# Patient Record
Sex: Male | Born: 1980 | Race: Asian | Hispanic: No | Marital: Single
Health system: Southern US, Community
[De-identification: ages and names within clinical notes are randomized; demographics above are authoritative.]

---

## 2005-12-06 ENCOUNTER — Emergency Department (HOSPITAL_COMMUNITY): Admission: EM | Admit: 2005-12-06 | Discharge: 2005-12-06 | Payer: Self-pay | Admitting: Family Medicine

## 2008-04-01 ENCOUNTER — Inpatient Hospital Stay (HOSPITAL_COMMUNITY): Admission: EM | Admit: 2008-04-01 | Discharge: 2008-04-03 | Payer: Self-pay | Admitting: Family Medicine

## 2008-11-02 ENCOUNTER — Emergency Department (HOSPITAL_COMMUNITY): Admission: EM | Admit: 2008-11-02 | Discharge: 2008-11-02 | Payer: Self-pay | Admitting: Emergency Medicine

## 2008-11-15 ENCOUNTER — Emergency Department (HOSPITAL_COMMUNITY): Admission: EM | Admit: 2008-11-15 | Discharge: 2008-11-16 | Payer: Self-pay | Admitting: Emergency Medicine

## 2010-05-04 LAB — BASIC METABOLIC PANEL
CO2: 29 mEq/L (ref 19–32)
Calcium: 9.1 mg/dL (ref 8.4–10.5)
GFR calc Af Amer: >60 mL/min (ref >60)
GFR calc non Af Amer: >60 mL/min (ref >60)
Glucose, Bld: 96 mg/dL (ref 70–99)
Potassium: 3.9 mEq/L (ref 3.5–5.1)
Sodium: 138 mEq/L (ref 135–145)

## 2010-05-04 LAB — CULTURE, BLOOD (ROUTINE X 2)
Culture: NO GROWTH
Culture: NO GROWTH

## 2010-05-04 LAB — CBC
HCT: 39.8 % (ref 39.0–52.0)
Hemoglobin: 12.6 g/dL — ABNORMAL LOW (ref 13.0–17.0)
RBC: 5.72 MIL/uL (ref 4.22–5.81)
RDW: 15.1 % (ref 11.5–15.5)

## 2010-05-04 LAB — DIFFERENTIAL
Basophils Absolute: 0.1 10*3/uL (ref 0.0–0.1)
Basophils Relative: 1 % (ref 0–1)
Lymphocytes Relative: 18 % (ref 12–46)
Lymphs Abs: 1.6 10*3/uL (ref 0.7–4.0)
Monocytes Relative: 11 % (ref 3–12)
Neutro Abs: 6.1 10*3/uL (ref 1.7–7.7)

## 2010-06-06 NOTE — Consult Note (Signed)
NAMEMARSHA, HILLMAN NO.:  [id number]   MEDICAL RECORD NO.:  [id number]          PATIENT TYPE:  INP   LOCATION:  3012                         FACILITY:  [facility]   PHYSICIAN:  Nadara Mustard, MD     DATE OF BIRTH:  03-18-1980   DATE OF CONSULTATION:  04/02/2008  DATE OF DISCHARGE:                                 CONSULTATION   HISTORY OF PRESENT ILLNESS:  The patient is a 30 year old gentleman with  no past medical problems, who by report cut his right leg on broken  glass about 1 week ago.  The patient presents for evaluation with  cellulitis to the right foot with lacerations to the right foot.  The  patient has been admitted and started on appropriate IV antibiotics.   PAST MEDICAL HISTORY:  No known significant past medical history.   SURGICAL HISTORY:  None.   ALLERGIES:  None.   REVIEW OF SYSTEMS:  None.   SOCIAL HISTORY:  The patient smokes.   PHYSICAL EXAMINATION:  VITAL SIGNS:  Stable.  GENERAL:  He is alert, oriented, afebrile.  He has no adenopathy.  EXTREMITIES:  There is no cellulitis ascending past the foot.  He has  multiple lacerations on the foot and ankle, which appear to be healing  well with good granulation tissue.  There is no abscess.  No purulent  drainage.  He has a good dorsalis pedis and posterior tibial pulse.  The  skin is wrinkling well with resolving cellulitis.   ASSESSMENT:  Lacerations, right foot with resolving cellulitis with his  foot neurovascularly intact.   PLAN:  Recommended dialysis, soap cleansing daily to the right foot  followed by Silvadene dressing changes to the wound.  He should wear a  postoperative shoe with the dressing and continue dressing changes until  the wound is completely healed.  I will follow up as needed.      Nadara Mustard, MD  Electronically Signed     MVD/MEDQ  D:  04/02/2008  T:  04/03/2008  Job:  [phone]

## 2010-06-06 NOTE — H&P (Signed)
Terry Baker, NEGRO NO.:  [id number]   MEDICAL RECORD NO.:  [id number]          PATIENT TYPE:  EMS   LOCATION:  MAJO                         FACILITY:  [facility]   PHYSICIAN:  Eduard Clos, MDDATE OF BIRTH:  12/15/1980   DATE OF ADMISSION:  04/01/2008  DATE OF DISCHARGE:                              HISTORY & PHYSICAL   CHIEF COMPLAINT:  Right lower extremity swelling.   HISTORY OF PRESENT ILLNESS:  A 30 year old male with no significant past  medical history presented to the ER because of increasing swelling and  erythema over the right foot over the last 3-4 days.  The patient states  that it began with a glass falling on his leg and eventually he started  to having lacerated wounds and it is swollen and erythematous.  He  denies any fevers or chills.  He has pain on standing and there is no  restriction of movement.  In the ER, the patient had an x-ray which  showed no evidence for a fracture and only showed soft tissue swelling.  The patient has been admitted for further management.  The patient  denies any chest pain or shortness of breath.  He denies any weakness of  limbs.  He denies any dizziness, loss of consciousness, abdominal pain,  nausea, vomiting, diarrhea, fever or chills.   PAST MEDICAL HISTORY:  Nothing significant.   PAST SURGICAL HISTORY:  None.   MEDICATIONS PRIOR TO ADMISSION:  None.   ALLERGIES:  NO KNOWN DRUG ALLERGIES.   SOCIAL HISTORY:  The patient smokes cigarettes, has been advised to quit  smoking.  Drinks alcohol occasionally.  He denies any drug abuse.   REVIEW OF SYSTEMS:  As per history of present illness, nothing else  significant.   PHYSICAL EXAMINATION:  GENERAL:  Patient examined at bedside, not in  acute distress.  VITAL SIGNS:  Blood pressure is 115/67, pulse 78 per minute, temperature  97.8, respirations 18 per minute.  O2 saturations 99%.  HEENT:  Anicteric, no pallor.  CHEST:  Bilateral air entry present.   No rhonchi, no crepitation.  HEART:  S1-S2 heard.  ABDOMEN:  Soft, nontender.  Bowel sounds heard.  CNS:   Alert, awake.  Oriented to time, place and person.  Moves upper  and lower extremities 5/5.  EXTREMITIES:  There is swelling extending from his toes to ankle on the  right foot with two lacerated wounds which are deep.  He has no active  discharge.  Both lacerations are on the dorsal side of the leg, one is  around 5-cm, the other one is 3-cm.  On palpation, there is no  tenderness.  There is no restriction of movement.  The rest of the  extremities are showing no edema.  Pulses felt.   LABORATORY DATA:  CBC and BMET have been ordered.  Blood cultures have  been ordered.  X-ray of the right foot showed no evidence for fracture,  soft tissue swelling is seen.   ASSESSMENT:  Right foot cellulitis, status post trauma.   PLAN:  We will  admit the patient to the medical floor, start on  vancomycin and Zosyn, and start the patient on also tetanus toxoid  injection, IV fluids.  Get an MRI of the right foot to rule out  osteomyelitis or abscess.  Further recommendations as condition evolves.      Eduard Clos, MD  Electronically Signed     ANK/MEDQ  D:  04/01/2008  T:  04/01/2008  Job:  [phone]

## 2010-06-06 NOTE — Discharge Summary (Signed)
NAMELITTLETON, HAUB NO.:  [id number]   MEDICAL RECORD NO.:  [id number]          PATIENT TYPE:  INP   LOCATION:  3012                         FACILITY:  [facility]   PHYSICIAN:  Charlestine Massed, MDDATE OF BIRTH:  06/21/1980   DATE OF ADMISSION:  04/01/2008  DATE OF DISCHARGE:                               DISCHARGE SUMMARY   PRIMARY CARE PHYSICIAN:  Unassigned.  The patient is advised to set up  primary care physician after he is discharged.   REASON FOR ADMISSION:  Right lower extremity swelling.   DISCHARGE DIAGNOSIS:  1. Right foot cellulitis secondary to trauma.  2. Traumatic right foot wound.  3. Tendon of the fourth digit on the right foot laceration.   DISCHARGE MEDICATIONS:  1. Doxycycline 100 mg p.o. b.i.d. for 12 more days.  2. Daily dressing for the right foot with Silvadene until it is      healed.   HOSPITAL COURSE:  [name] is a 30 year old gentleman who is from  [place] and speaks very little English who came to the emergency room  because he developed increased swelling of the right foot over the past  3 to 4 days when he presented.  The patient had a trauma last Saturday  when a glass piece fell on his foot and he had two lacerations on the  dorsum of his right foot.  He just cleaned it with soap and water and  there did put on any ointments and did not take care of the wound well  so when he presented he was having swelling which has been developing  over 3 days and with pain.  The patient was found to have cellulitis of  the right foot and it was red and erythematous.  An MRA was done by the  admitting physician which revealed no evidence of abscess or fasciitis.  There was a mild laceration to the fourth extensor tendon of the right  foot seen.  Orthopedic consult was called in view of that finding.  Orthopedics suggested no further management needed just to clean the  wound with soap daily and apply Silvadene dressing every day  until the  wound is healed.  The patient will be continued on doxycycline orally.   During when he was admitted, the patient was initially given Zosyn and  vancomycin together.  At that time he developed hives.  The patient is  being currently labeled as allergic to vancomycin and Zosyn and we are  not clear which one caused the allergy at this time.   DISCHARGE INSTRUCTIONS:  1. The patient has been advised to set up a  primary care doctor once      he is discharged and to see for follow-up and the patient needs to      follow up with the primary care doctor to check on his wound in 1      week.  If he cannot set up by a doctor, the patient has been given      instructions to either go to [facility] or to come to      the [facility] surgical clinic for further follow-up of      his wound.  2. Daily dressing to the right foot wound, clean with soap and saline      and dried and then apply Silvadene dressing daily until the wound      is completely healed.  Home care set up has been arranged by case      manager for dressing on the wound.  3. The patient has been advised to use postop boots as recommended by      orthopedics.  Boot has been supplied to him already in the      hospital.  The patient has been advised to use it on the right foot      when walking.  4. Keep the right foot dry, avoid soaking.   FOLLOW UP:  The patient has again been given instructions as mentioned  above to follow up with a  primary doctor or at any urgent care center  for  wound follow-up.      Charlestine Massed, MD  Electronically Signed     UT/MEDQ  D:  04/03/2008  T:  04/03/2008  Job:  [id number]

## 2019-06-03 ENCOUNTER — Encounter (HOSPITAL_COMMUNITY): Payer: Self-pay

## 2019-06-03 ENCOUNTER — Emergency Department (HOSPITAL_COMMUNITY): Payer: BC Managed Care – PPO

## 2019-06-03 ENCOUNTER — Emergency Department (HOSPITAL_COMMUNITY)
Admission: EM | Admit: 2019-06-03 | Discharge: 2019-06-03 | Disposition: A | Discharge status: Home / Self Care | Payer: BC Managed Care – PPO | Attending: Emergency Medicine | Admitting: Emergency Medicine

## 2019-06-03 DIAGNOSIS — Z20822 Contact with and (suspected) exposure to covid-19: Secondary | ICD-10-CM | POA: Diagnosis not present

## 2019-06-03 DIAGNOSIS — Z87891 Personal history of nicotine dependence: Secondary | ICD-10-CM | POA: Diagnosis not present

## 2019-06-03 DIAGNOSIS — R059 Cough, unspecified: Secondary | ICD-10-CM

## 2019-06-03 DIAGNOSIS — R05 Cough: Secondary | ICD-10-CM | POA: Diagnosis not present

## 2019-06-03 LAB — POC SARS CORONAVIRUS 2 AG -  ED: SARS Coronavirus 2 Ag: NEGATIVE

## 2019-06-03 MED ORDER — AZITHROMYCIN 250 MG PO TABS: ORAL_TABLET | ORAL | 0 refills | Status: AC

## 2019-06-03 MED ORDER — BENZONATATE 100 MG PO CAPS: 100.0000 mg | ORAL_CAPSULE | Freq: Three times a day (TID) | ORAL | 0 refills | Status: DC

## 2019-06-03 MED ORDER — CEFTRIAXONE SODIUM 1 G IJ SOLR
1.0000 g | Freq: Once | INTRAMUSCULAR | Status: AC
Start: 2019-06-03 — End: 2019-06-03
  Administered 2019-06-03: 1 g via INTRAMUSCULAR
  Filled 2019-06-03: qty 10

## 2019-06-03 MED ORDER — LIDOCAINE HCL (PF) 1 % IJ SOLN
INTRAMUSCULAR | Status: AC
  Administered 2019-06-03: 22:00:00 1 mL
  Filled 2019-06-03: qty 5

## 2019-06-03 NOTE — Discharge Instructions (Addendum)
You were seen today for cough. Your chest xray was clear and your covid test is pending.  Please make sure you wear a mask and isolate yourself from others until you know your test results.  We are going to treat you with antibiotics and refer you to a new primary medical doctor to follow-up with.  If you have any new or worsening symptoms then please return to the emergency department.

## 2019-06-03 NOTE — ED Provider Notes (Signed)
[place] EMERGENCY DEPARTMENT Provider Note   CSN: [id number] Arrival date & time: 06/03/19  1946     History Chief Complaint  Patient presents with  . Cough    Terry Baker is a 39 y.o. male.  Patient is a 39 year old male with past medical history of smoking presented to the emergency department for cough for the past 2 weeks.  Reports he has had a dry cough without any other symptoms.  Denies any shortness of breath, chest pain, nausea, vomiting, decreased appetite, weight loss.  Denies any sick contacts or recent travel.  Burmese interpreter was used during the entirety of this visit.        History reviewed. No pertinent past medical history.  There are no problems to display for this patient.   History reviewed. No pertinent surgical history.     No family history on file.  Social History   Tobacco Use  . Smoking status: Not on file  Substance Use Topics  . Alcohol use: Not on file  . Drug use: Not on file    Home Medications Prior to Admission medications   Medication Sig Start Date End Date Taking? Authorizing Provider  azithromycin (ZITHROMAX Z-PAK) 250 MG tablet Take 2 tablets (500 mg total) by mouth daily for 1 day, THEN 1 tablet (250 mg total) daily for 4 days. 06/03/19 06/08/19  Alveria Apley, PA-C  benzonatate (TESSALON) 100 MG capsule Take 1 capsule (100 mg total) by mouth every 8 (eight) hours. 06/03/19   Alveria Apley, PA-C    Allergies    Patient has no known allergies.  Review of Systems   Review of Systems  Constitutional: Negative.   HENT: Negative.   Eyes: Negative for visual disturbance.  Respiratory: Positive for cough. Negative for chest tightness, shortness of breath and wheezing.   Cardiovascular: Negative.   Gastrointestinal: Negative for abdominal pain, nausea and vomiting.  Genitourinary: Negative for dysuria.  Musculoskeletal: Negative for back pain.  Allergic/Immunologic: Negative for immunocompromised  state.  Neurological: Negative for dizziness and light-headedness.    Physical Exam Updated Vital Signs BP 113/67 (BP Location: Left Arm)   Pulse 92   Temp (!) 100.5 F (38.1 C) (Oral)   Resp 16   SpO2 97%   Physical Exam Vitals and nursing note reviewed.  Constitutional:      General: He is not in acute distress.    Appearance: Normal appearance. He is not ill-appearing, toxic-appearing or diaphoretic.  HENT:     Head: Normocephalic.     Nose: Nose normal.     Mouth/Throat:     Mouth: Mucous membranes are moist.  Eyes:     Conjunctiva/sclera: Conjunctivae normal.  Cardiovascular:     Rate and Rhythm: Normal rate.  Pulmonary:     Effort: Pulmonary effort is normal.  Abdominal:     General: Abdomen is flat.  Skin:    General: Skin is dry.  Neurological:     General: No focal deficit present.     Mental Status: He is alert.  Psychiatric:        Mood and Affect: Mood normal.     ED Results / Procedures / Treatments   Labs (all labs ordered are listed, but only abnormal results are displayed) Labs Reviewed  POC SARS CORONAVIRUS 2 AG -  ED    EKG None  Radiology DG Chest Portable 1 View  Result Date: 06/03/2019 CLINICAL DATA:  Cough EXAM: PORTABLE CHEST 1 VIEW COMPARISON:  None. FINDINGS: The heart size and mediastinal contours are within normal limits. Both lungs are clear. The visualized skeletal structures are unremarkable. IMPRESSION: No active disease. Electronically Signed   By: Jonna Clark M.D.   On: 06/03/2019 21:31    Procedures Procedures (including critical care time)  Medications Ordered in ED Medications  cefTRIAXone (ROCEPHIN) injection 1 g (has no administration in time range)    ED Course  I have reviewed the triage vital signs and the nursing notes.  Pertinent labs & imaging results that were available during my care of the patient were reviewed by me and considered in my medical decision making (see chart for details).  Clinical  Course as of Jun 02 2148  Wed Jun 03, 2019  2139 Well-appearing 39 year old presenting with cough for 2 weeks.  Low-grade fever of 100.5.  He is PERC negative and denies any other symptoms, recent travel.  His chest x-ray is completely clear and his lungs sound clear.  However, given the duration of 2 weeks and now fever I do have a concern for a community-acquired pneumonia.  Covid test is pending.  Will start patient on antibiotics and advised to self isolate and advised on return precautions.  He was referred to a new primary medical doctor.  He otherwise appears well and does not meet admission criteria.   [KM]    Clinical Course User Index [KM] Jeral Pinch   MDM Rules/Calculators/A&P                      Based on review of vitals, medical screening exam, lab work and/or imaging, there does not appear to be an acute, emergent etiology for the patient's symptoms. Counseled pt on good return precautions and encouraged both PCP and ED follow-up as needed.  Prior to discharge, I also discussed incidental imaging findings with patient in detail and advised appropriate, recommended follow-up in detail.  Clinical Impression: 1. Cough     Disposition: Discharge  Prior to providing a prescription for a controlled substance, I independently reviewed the patient's recent prescription history on the [place] Controlled Substance Reporting System. The patient had no recent or regular prescriptions and was deemed appropriate for a brief, less than 3 day prescription of narcotic for acute analgesia.  This note was prepared with assistance of Conservation officer, historic buildings. Occasional wrong-word or sound-a-like substitutions may have occurred due to the inherent limitations of voice recognition software.  Final Clinical Impression(s) / ED Diagnoses Final diagnoses:  Cough    Rx / DC Orders ED Discharge Orders         Ordered    benzonatate (TESSALON) 100 MG capsule  Every 8  hours     06/03/19 2150    azithromycin (ZITHROMAX Z-PAK) 250 MG tablet     06/03/19 2150           Jeral Pinch 06/03/19 2151    Arby Barrette, MD 06/04/19 [phone]

## 2019-06-03 NOTE — ED Triage Notes (Signed)
Pt reports that he has had a cough for the past six weeks. Fevers today.

## 2021-02-11 IMAGING — DX DG CHEST 1V PORT
1 series · 1 of 1 positions shown · non-contrast
Comparison: None.

CLINICAL DATA: Cough

EXAM:
PORTABLE CHEST 1 VIEW

[chest]
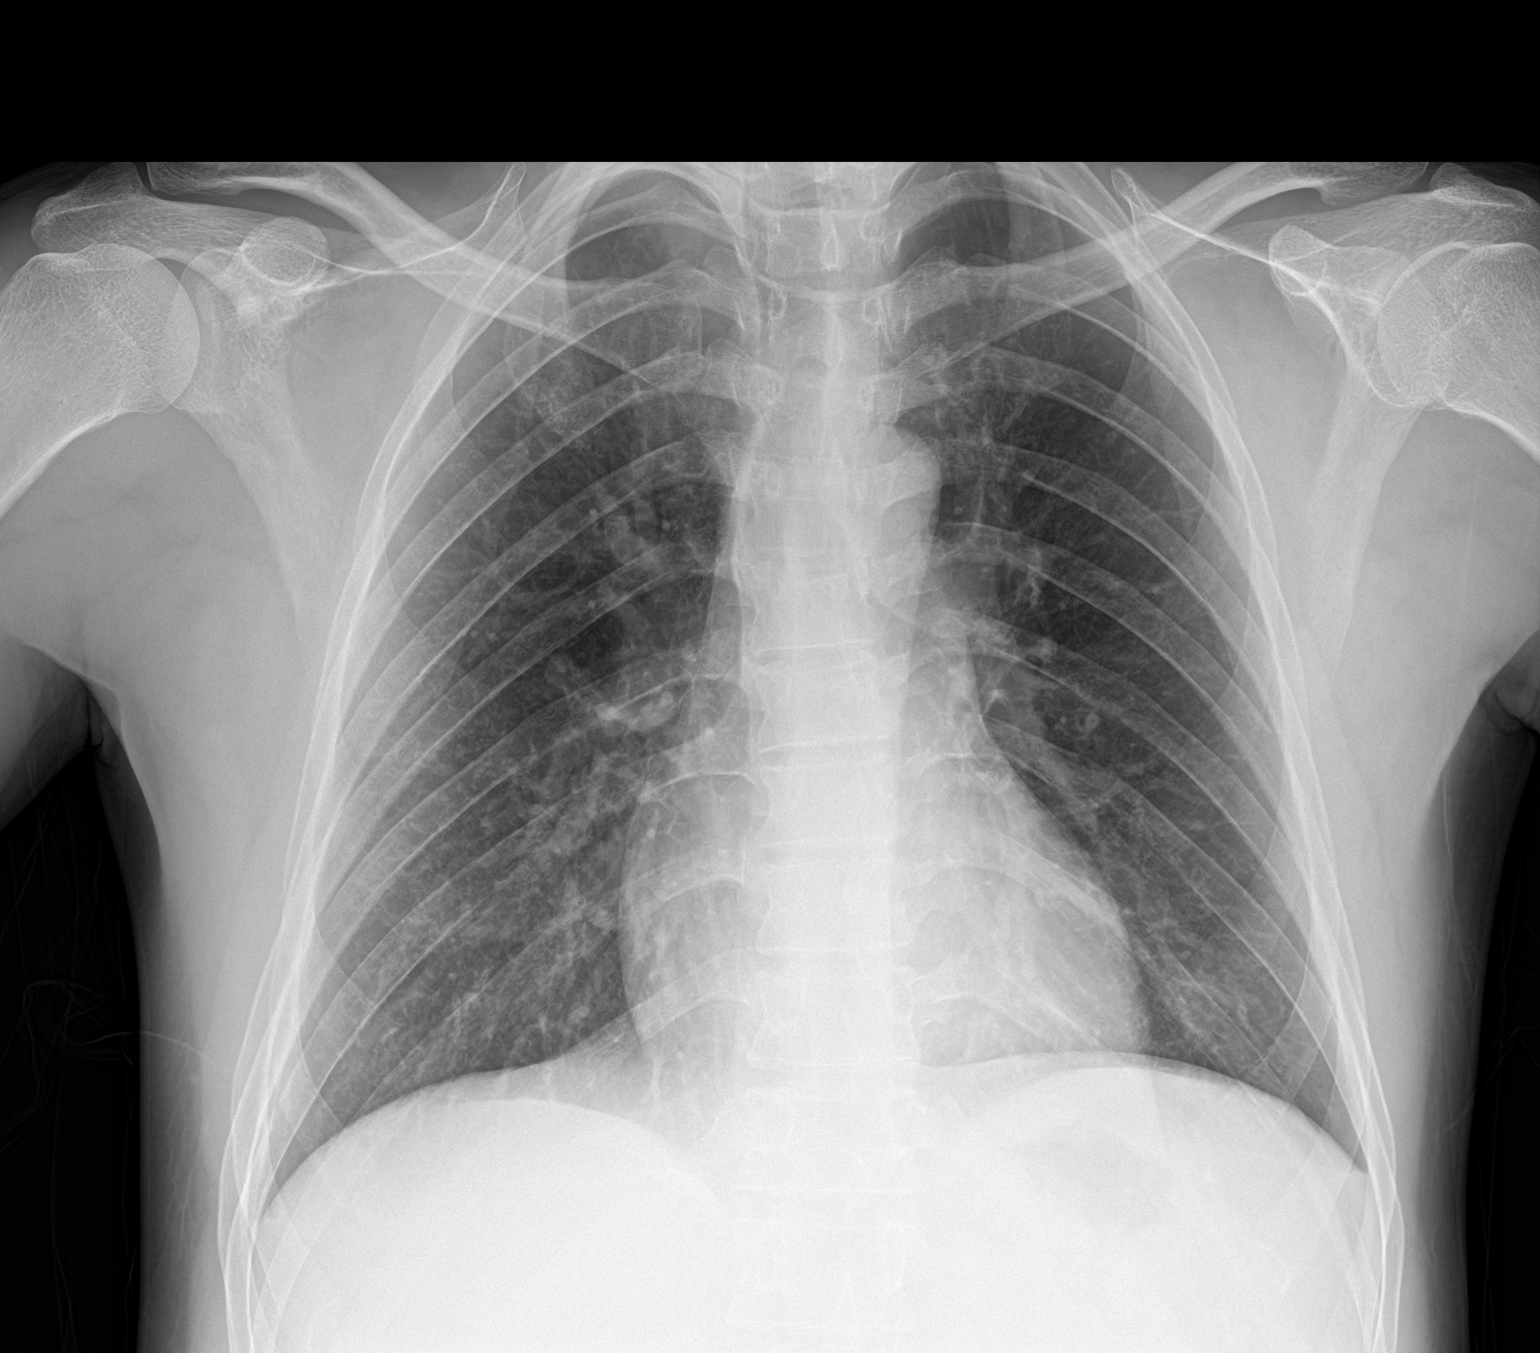

[1 of 1 positions shown; findings below may reference images not displayed]

FINDINGS: The heart size and mediastinal contours are within normal limits.
Both lungs are clear. The visualized skeletal structures are
unremarkable.
IMPRESSION: No active disease.

## 2023-02-08 ENCOUNTER — Emergency Department (HOSPITAL_COMMUNITY): Payer: Self-pay

## 2023-02-08 ENCOUNTER — Emergency Department (HOSPITAL_COMMUNITY)
Admission: EM | Admit: 2023-02-08 | Discharge: 2023-02-09 | Disposition: A | Discharge status: Home / Self Care | Payer: Self-pay | Attending: Emergency Medicine | Admitting: Emergency Medicine

## 2023-02-08 ENCOUNTER — Encounter (HOSPITAL_COMMUNITY): Payer: Self-pay

## 2023-02-08 DIAGNOSIS — J020 Streptococcal pharyngitis: Secondary | ICD-10-CM | POA: Insufficient documentation

## 2023-02-08 DIAGNOSIS — Z20822 Contact with and (suspected) exposure to covid-19: Secondary | ICD-10-CM | POA: Insufficient documentation

## 2023-02-08 LAB — RESP PANEL BY RT-PCR (RSV, FLU A&B, COVID)  RVPGX2
Influenza A by PCR: NEGATIVE
Influenza B by PCR: NEGATIVE
Resp Syncytial Virus by PCR: NEGATIVE
SARS Coronavirus 2 by RT PCR: NEGATIVE

## 2023-02-08 LAB — GROUP A STREP BY PCR: Group A Strep by PCR: DETECTED — AB

## 2023-02-08 NOTE — ED Provider Triage Note (Signed)
Emergency Medicine Provider Triage Evaluation Note  Terry Baker , a 43 y.o. male  was evaluated in triage.  Pt complains of your throat, cough for 2 weeks.  Review of Systems  Positive: Cough, dry  Negative: Pain, shortness of breath, nausea vomiting diarrhea  Physical Exam  BP 110/87 (BP Location: Right Arm)   Pulse 86   Temp 98.3 F (36.8 C)   Resp 18   Ht 5\' 5"  (1.651 m)   Wt 68 kg   SpO2 98%   BMI 24.96 kg/m  Gen:   Awake, no distress   Resp:  Normal effort  MSK:   Moves extremities without difficulty  Other:  Uvula midline no obvious abscess  Medical Decision Making  Medically screening exam initiated at 7:39 PM.  Appropriate orders placed.  Terry Baker was informed that the remainder of the evaluation will be completed by another provider, this initial triage assessment does not replace that evaluation, and the importance of remaining in the ED until their evaluation is complete.  Denies past medical history, otherwise health.  Denies any fevers, chills.   [name], PA-C 02/08/23 1940

## 2023-02-08 NOTE — ED Triage Notes (Signed)
Upper resp. Symptoms. Sore throat, cough and congestion. Symptoms began 2 weeks ago

## 2023-02-09 MED ORDER — BENZONATATE 100 MG PO CAPS: 100.0000 mg | ORAL_CAPSULE | Freq: Three times a day (TID) | ORAL | 0 refills | Status: AC | PRN

## 2023-02-09 MED ORDER — PENICILLIN G BENZATHINE 1200000 UNIT/2ML IM SUSY
1.2000 10*6.[IU] | PREFILLED_SYRINGE | Freq: Once | INTRAMUSCULAR | Status: AC
  Administered 2023-02-09: 1.2 10*6.[IU] via INTRAMUSCULAR
  Filled 2023-02-09: qty 2

## 2023-02-09 MED ORDER — DEXAMETHASONE 4 MG PO TABS
10.0000 mg | ORAL_TABLET | Freq: Once | ORAL | Status: AC
  Administered 2023-02-09: 10 mg via ORAL
  Filled 2023-02-09: qty 3

## 2023-02-09 MED ORDER — BENZONATATE 100 MG PO CAPS
100.0000 mg | ORAL_CAPSULE | Freq: Once | ORAL | Status: AC
  Administered 2023-02-09: 100 mg via ORAL
  Filled 2023-02-09: qty 1

## 2023-02-09 NOTE — ED Provider Notes (Signed)
[place] EMERGENCY DEPARTMENT AT [facility] Provider Note   CSN: [id number] Arrival date & time: 02/08/23  1849     History {Add pertinent medical, surgical, social history, OB history to HPI:1} Chief Complaint  Patient presents with   Sore Throat    Brave Vanwagoner is a 43 y.o. male.  43 yo M here with sore throat for over a week. Fever and cough. No trouble swallowing.   Sore Throat       Home Medications Prior to Admission medications   Medication Sig Start Date End Date Taking? Authorizing Provider  benzonatate (TESSALON) 100 MG capsule Take 1 capsule (100 mg total) by mouth 3 (three) times daily as needed for cough. 02/09/23  Yes Enrica Corliss, Barbara Cower, MD      Allergies    Patient has no known allergies.    Review of Systems   Review of Systems  Physical Exam Updated Vital Signs BP 112/79 (BP Location: Left Arm)   Pulse 68   Temp 98.4 F (36.9 C) (Oral)   Resp 15   Ht 5\' 5"  (1.651 m)   Wt 68 kg   SpO2 99%   BMI 24.96 kg/m  Physical Exam  ED Results / Procedures / Treatments   Labs (all labs ordered are listed, but only abnormal results are displayed) Labs Reviewed  GROUP A STREP BY PCR - Abnormal; Notable for the following components:      Result Value   Group A Strep by PCR DETECTED (*)    All other components within normal limits  RESP PANEL BY RT-PCR (RSV, FLU A&B, COVID)  RVPGX2    EKG None  Radiology DG Chest 2 View Result Date: 02/08/2023 CLINICAL DATA:  Cough and sore throat. EXAM: CHEST - 2 VIEW COMPARISON:  06/03/2019 FINDINGS: Normal heart size. There is no pleural fluid, interstitial edema or airspace disease. No pneumothorax identified. Visualized osseous structures appear intact. IMPRESSION: No acute cardiopulmonary disease. Electronically Signed   By: Signa Kell M.D.   On: 02/08/2023 20:03    Procedures Procedures  {Document cardiac monitor, telemetry assessment procedure when appropriate:1}  Medications Ordered in  ED Medications  penicillin g benzathine (BICILLIN LA) 1200000 UNIT/2ML injection 1.2 Million Units (has no administration in time range)  dexamethasone (DECADRON) tablet 10 mg (has no administration in time range)  benzonatate (TESSALON) capsule 100 mg (has no administration in time range)    ED Course/ Medical Decision Making/ A&P   {   Click here for ABCD2, HEART and other calculatorsREFRESH Note before signing :1}                              Medical Decision Making Risk Prescription drug management.   ***  {Document critical care time when appropriate:1} {Document review of labs and clinical decision tools ie heart score, Chads2Vasc2 etc:1}  {Document your independent review of radiology images, and any outside records:1} {Document your discussion with family members, caretakers, and with consultants:1} {Document social determinants of health affecting pt's care:1} {Document your decision making why or why not admission, treatments were needed:1} Final Clinical Impression(s) / ED Diagnoses Final diagnoses:  Strep throat    Rx / DC Orders ED Discharge Orders          Ordered    benzonatate (TESSALON) 100 MG capsule  3 times daily PRN        02/09/23 0323

## 2023-02-09 NOTE — Discharge Instructions (Addendum)
Strep ??????????? ?????? Strep throat ??? ?????????????????????????? ???????????????? ?????????????????????? ????????? ????????????? ????????????? ??????? ???? ? ?????? ?? ???????? ??????????? ????????? ???????????? ???????? ?????????????? ??????????? ?????????????? ??????????? ?????????? ????????????? ????????????????? ??????????? ????????? ????????????????????????? ?????????????????? ??????????????? ????????????????????????????????????? ????????????????????? ???????????????? ??????????????????? ??????????? ??????????? ????????????? ??????????????????? ?????????? ????????????? ???????????? ????????????? pharyngitis ?????????????? ???????????????? ???????? ??????????? Streptococcus pyogenes ??????????????????????????? ???????????????????????????? ????? ?????? ????????????? ?????????????? ??????????-  ?????? ????????????????????????? ??????????? ????????? ????????????????????????????????? ????????? ??????????? ????????????????? ???????????? ??????????? ?????????????? ?????????????????????  ????????? ???????? ?????????????? ?????????? ???????????? ??? ???????????????  ???????? ???????????????????????? ??????????????????????? ?????????? ????????? ????????????? ????????? ????????? ?????????????-  ????????? ????????? ???????????????  ???????? ?????????????? ????????? ????????? ????????? ?????????????? ????????????  ???????????????? ????????? ????????????  ????? ????????? ?????????????? ????????? ????????? ?????????????????  ???????????? ??????????????? ???????????????????  ?????????????????????  ????????????? ???????????????? ??? ?????????? ????? ??????????????????  ??????????? ??????????????????????? ????????????????? ????????????? ??????? ??????????????????????????? ???????????????????????? ???????:  Rapid strep ????????????? ????????????????? ???????? ???????????????? ???????????? ????????? ??????????? ??????????????? ????????????????? ???????????????????  ???????????????????? ?????????????  ???????????????????? ?????????? ???????? ???????????????????????????????? ??????????????? ??????????? ??????????? ??????????????? 1 ????????? 2 ????????? ??????????????????? ????? ???????????? ??????????? ???????????-  ?????????????? (?????????)?  ????????? ????????? ??????????????????? ????????????-  o Ibuprofen ????????? acetaminophen?  o ????????? ???? 18 ???????????????????????  o ?????????????????  o ??????????????????? ???????? ????????????????????? ?????????- ??????????    ????????????????????????????????????????????????????? ????????????????? ?????????????????????? ????????  ???????????????????????????????????????????????????? ???????????????????????? ??????????????? ?????????????????????? ???????????????? ??????????????    ???????? ??????????????? ????????????????? ????????????? ???????????????????? ???????????????  ?????????????????? ??????????????????????  ???????????? ?????????? ????????-  o ???????? ????? ????????? ?????????? ????????? ?????????  o ??????????? ???????????? ????????? ???????????? ?????????

## 2023-05-09 ENCOUNTER — Other Ambulatory Visit: Payer: Self-pay

## 2023-05-09 ENCOUNTER — Emergency Department (HOSPITAL_COMMUNITY)

## 2023-05-09 ENCOUNTER — Emergency Department (HOSPITAL_COMMUNITY)
Admission: EM | Admit: 2023-05-09 | Discharge: 2023-05-10 | Disposition: A | Discharge status: Home / Self Care | Attending: Emergency Medicine | Admitting: Emergency Medicine

## 2023-05-09 DIAGNOSIS — R0602 Shortness of breath: Secondary | ICD-10-CM | POA: Diagnosis present

## 2023-05-09 DIAGNOSIS — F17210 Nicotine dependence, cigarettes, uncomplicated: Secondary | ICD-10-CM | POA: Insufficient documentation

## 2023-05-09 DIAGNOSIS — R0600 Dyspnea, unspecified: Secondary | ICD-10-CM | POA: Insufficient documentation

## 2023-05-09 DIAGNOSIS — E876 Hypokalemia: Secondary | ICD-10-CM

## 2023-05-09 NOTE — ED Provider Triage Note (Signed)
 Emergency Medicine Provider Triage Evaluation Note  Terry Baker , a 43 y.o. male  was evaluated in triage.  Pt complains of ongoing shortness of breath for the past 3 weeks.  He states that he was provided an inhaler which is no longer providing relief.  He does endorse a productive cough with gray-colored sputum.  He states that he feels short of breath with exertion  Review of Systems  Positive:  Negative:   Physical Exam  BP (!) 148/80 (BP Location: Right Arm)   Pulse 73   Temp 98.2 F (36.8 C) (Oral)   Resp (!) 24   SpO2 96%  Gen:   Awake, no distress   Resp:  Normal effort  MSK:   Moves extremities without difficulty  Other:    Medical Decision Making  Medically screening exam initiated at 11:40 PM.  Appropriate orders placed.  Terry Baker was informed that the remainder of the evaluation will be completed by another provider, this initial triage assessment does not replace that evaluation, and the importance of remaining in the ED until their evaluation is complete.     Elisa Guest, PA-C 05/09/23 2341

## 2023-05-09 NOTE — ED Triage Notes (Signed)
 Pt reports SOB x 3 weeks. Worsening over the past three days no longer relieved with inhaler. Sts onset with excretion. Endorses dry cough. No fevers/chills.   [employer] Used

## 2023-05-10 ENCOUNTER — Emergency Department (HOSPITAL_COMMUNITY)

## 2023-05-10 LAB — RESP PANEL BY RT-PCR (RSV, FLU A&B, COVID)  RVPGX2
Influenza A by PCR: NEGATIVE
Influenza B by PCR: NEGATIVE
Resp Syncytial Virus by PCR: NEGATIVE
SARS Coronavirus 2 by RT PCR: NEGATIVE

## 2023-05-10 LAB — BASIC METABOLIC PANEL WITH GFR
Anion gap: 10 (ref 5–15)
BUN: 15 mg/dL (ref 6–20)
CO2: 21 mmol/L — ABNORMAL LOW (ref 22–32)
Calcium: 9 mg/dL (ref 8.9–10.3)
Chloride: 108 mmol/L (ref 98–111)
Creatinine, Ser: 0.88 mg/dL (ref 0.61–1.24)
GFR, Estimated: >60 mL/min (ref >60)
Glucose, Bld: 144 mg/dL — ABNORMAL HIGH (ref 70–99)
Potassium: 3.3 mmol/L — ABNORMAL LOW (ref 3.5–5.1)
Sodium: 139 mmol/L (ref 135–145)

## 2023-05-10 LAB — CBC
HCT: 45.3 % (ref 39.0–52.0)
Hemoglobin: 13.8 g/dL (ref 13.0–17.0)
MCH: 20.8 pg — ABNORMAL LOW (ref 26.0–34.0)
MCHC: 30.5 g/dL (ref 30.0–36.0)
MCV: 68.1 fL — ABNORMAL LOW (ref 80.0–100.0)
Platelets: 262 10*3/uL (ref 150–400)
RBC: 6.65 MIL/uL — ABNORMAL HIGH (ref 4.22–5.81)
RDW: 17 % — ABNORMAL HIGH (ref 11.5–15.5)
WBC: 8.9 10*3/uL (ref 4.0–10.5)
nRBC: 0 % (ref 0.0–0.2)

## 2023-05-10 LAB — BRAIN NATRIURETIC PEPTIDE: B Natriuretic Peptide: 3.3 pg/mL (ref 0.0–100.0)

## 2023-05-10 LAB — TROPONIN I (HIGH SENSITIVITY)
Troponin I (High Sensitivity): <2 ng/L (ref <18)
Troponin I (High Sensitivity): <2 ng/L (ref <18)

## 2023-05-10 LAB — MAGNESIUM: Magnesium: 2.2 mg/dL (ref 1.7–2.4)

## 2023-05-10 MED ORDER — ALBUTEROL SULFATE HFA 108 (90 BASE) MCG/ACT IN AERS: 1.0000 | INHALATION_SPRAY | Freq: Four times a day (QID) | RESPIRATORY_TRACT | 0 refills | Status: AC | PRN

## 2023-05-10 MED ORDER — IPRATROPIUM-ALBUTEROL 0.5-2.5 (3) MG/3ML IN SOLN
3.0000 mL | Freq: Once | RESPIRATORY_TRACT | Status: AC
  Administered 2023-05-10: 3 mL via RESPIRATORY_TRACT
  Filled 2023-05-10: qty 3

## 2023-05-10 MED ORDER — PREDNISONE 10 MG PO TABS: 20.0000 mg | ORAL_TABLET | Freq: Every day | ORAL | 0 refills | Status: AC

## 2023-05-10 MED ORDER — IOHEXOL 350 MG/ML SOLN
75.0000 mL | Freq: Once | INTRAVENOUS | Status: AC | PRN
  Administered 2023-05-10: 75 mL via INTRAVENOUS

## 2023-05-10 MED ORDER — POTASSIUM CHLORIDE 20 MEQ PO PACK
60.0000 meq | PACK | Freq: Once | ORAL | Status: AC
  Administered 2023-05-10: 60 meq via ORAL
  Filled 2023-05-10: qty 3

## 2023-05-10 NOTE — Discharge Instructions (Addendum)
 Your testing today was unremarkable, you can use the inhaler that we have prescribed and the prednisone  for the next 5 days  See your doctor in 3 days for recheck  We have given you a replacement dose of potassium, you do not need to take any more at home, just eat a regular diet  ER for severe worsening symptoms

## 2023-05-10 NOTE — ED Notes (Signed)
 Pt off unit to CT

## 2023-05-10 NOTE — ED Provider Notes (Addendum)
 [place] EMERGENCY DEPARTMENT AT [facility] Provider Note   CSN: [id number] Arrival date & time: 05/09/23  2327     History  Chief Complaint  Patient presents with   Shortness of Breath    Terry Baker is a 43 y.o. male.  The patient, with no known medical history, presents to the ER with shortness of breath that has been ongoing for three weeks but has worsened over the last three days. He describes feeling very tired and having difficulty breathing, especially when walking. He also reports a cough that has been disturbing sleep for the last two days. Denies any nasal congestion, mucus production, or history of asthma. He does, however, have a history of smoking seven cigarettes a day. She also mentions a child with asthma. He denies any abdominal pain, nausea, vomiting, or leg swelling. He also denies any recent fever. Admits to drinking alcohol, but not excessively.     Home Medications Prior to Admission medications   Medication Sig Start Date End Date Taking? Authorizing Provider  benzonatate  (TESSALON ) 100 MG capsule Take 1 capsule (100 mg total) by mouth 3 (three) times daily as needed for cough. 02/09/23   Mesner, Reymundo Caulk, MD      Allergies    Patient has no known allergies.    Review of Systems   Review of Systems  Constitutional:  Negative for appetite change, chills and fever.  HENT:  Negative for congestion, ear pain and sore throat.   Eyes:  Negative for pain and visual disturbance.  Respiratory:  Positive for cough and shortness of breath.   Gastrointestinal:  Negative for abdominal pain and vomiting.  Genitourinary:  Negative for dysuria and hematuria.  Musculoskeletal:  Negative for arthralgias and back pain.  Skin:  Negative for color change and rash.  Neurological:  Negative for seizures and syncope.  All other systems reviewed and are negative.   Physical Exam Updated Vital Signs BP 113/81   Pulse 61   Temp 98 F (36.7 C)   Resp 20   SpO2 99%   Physical Exam Vitals and nursing note reviewed.  Constitutional:      General: He is not in acute distress.    Appearance: He is well-developed.  HENT:     Head: Normocephalic and atraumatic.  Eyes:     Conjunctiva/sclera: Conjunctivae normal.  Cardiovascular:     Rate and Rhythm: Normal rate and regular rhythm.     Heart sounds: No murmur heard. Pulmonary:     Comments: Diminished breath sounds throughout, no crackles noted Abdominal:     Palpations: Abdomen is soft.     Tenderness: There is no abdominal tenderness.  Musculoskeletal:        General: No swelling.     Cervical back: Neck supple.  Skin:    General: Skin is warm and dry.     Capillary Refill: Capillary refill takes less than 2 seconds.  Neurological:     Mental Status: He is alert.  Psychiatric:        Mood and Affect: Mood normal.     ED Results / Procedures / Treatments   Labs (all labs ordered are listed, but only abnormal results are displayed) Labs Reviewed  BASIC METABOLIC PANEL WITH GFR - Abnormal; Notable for the following components:      Result Value   Potassium 3.3 (*)    CO2 21 (*)    Glucose, Bld 144 (*)    All other components within normal limits  CBC - Abnormal; Notable for the following components:   RBC 6.65 (*)    MCV 68.1 (*)    MCH 20.8 (*)    RDW 17.0 (*)    All other components within normal limits  RESP PANEL BY RT-PCR (RSV, FLU A&B, COVID)  RVPGX2  BRAIN NATRIURETIC PEPTIDE  MAGNESIUM  TROPONIN I (HIGH SENSITIVITY)  TROPONIN I (HIGH SENSITIVITY)    EKG EKG Interpretation Date/Time:  Friday May 10 2023 09:44:15 EDT Ventricular Rate:  67 PR Interval:  139 QRS Duration:  115 QT Interval:  426 QTC Calculation: 450 R Axis:   100  Text Interpretation: Sinus rhythm Nonspecific intraventricular conduction delay  ST changes unchanged from arrival EKG Confirmed by Abby Hocking [id number]) on 05/10/2023 10:03:02 AM  Radiology DG Chest 2 View Result Date: 05/09/2023 CLINICAL  DATA:  Shortness of breath. EXAM: CHEST - 2 VIEW COMPARISON:  PA and lateral chest 02/08/2023 FINDINGS: The heart size and mediastinal contours are within normal limits. Both lungs are clear. The visualized skeletal structures are unremarkable. IMPRESSION: No active cardiopulmonary disease.  Stable chest. Electronically Signed   By: Denman Fischer M.D.   On: 05/09/2023 23:59    Procedures Procedures    Medications Ordered in ED Medications  potassium chloride  (KLOR-CON ) packet 60 mEq (60 mEq Oral Given 05/10/23 0944)  ipratropium-albuterol  (DUONEB) 0.5-2.5 (3) MG/3ML nebulizer solution 3 mL (3 mLs Nebulization Given 05/10/23 0945)  ipratropium-albuterol  (DUONEB) 0.5-2.5 (3) MG/3ML nebulizer solution 3 mL (3 mLs Nebulization Given 05/10/23 1320)  iohexol  (OMNIPAQUE ) 350 MG/ML injection 75 mL (75 mLs Intravenous Contrast Given 05/10/23 1346)    ED Course/ Medical Decision Making/ A&P                                  Medical Decision Making:   Terry Baker is a 43 y.o. male who presented to the ED today with dyspnea detailed above.    Patient placed on continuous vitals and telemetry monitoring while in ED which was reviewed periodically.  Complete initial physical exam performed, notably the patient reports of increased wob with ambulation and has diminished breath sounds throughout.    Reviewed and confirmed nursing documentation for past medical history, family history, social history.    Initial Assessment:   With the patient's presentation of dyspnea, ddx includes PTX, PNA, PE, cardiac etiology, COPD vs Asthma vs restrictive lung disease. These are considered less likely due to history of present illness and physical exam findings.   This is most consistent with an acute complicated illness  Initial Plan:  CTA to evaluate for PE versus infiltrate not seen on CXR  Screening labs including CBC and Metabolic panel to evaluate for infectious or metabolic etiology of disease.  CXR to evaluate  for structural/infectious intrathoracic pathology.  EKG to evaluate for cardiac pathology Objective evaluation as below reviewed   Initial Study Results:   Laboratory  All laboratory results reviewed without evidence of clinically relevant pathology.   Exceptions include: K 3.3>repleted    EKG EKG was reviewed independently. IVCD  Radiology:  All images reviewed independently. Agree with radiology report at this time.   DG Chest 2 View Result Date: 05/09/2023 CLINICAL DATA:  Shortness of breath. EXAM: CHEST - 2 VIEW COMPARISON:  PA and lateral chest 02/08/2023 FINDINGS: The heart size and mediastinal contours are within normal limits. Both lungs are clear. The visualized skeletal structures are unremarkable. IMPRESSION: No active cardiopulmonary disease.  Stable chest. Electronically Signed   By: Denman Fischer M.D.   On: 05/09/2023 23:59   Final Assessment and Plan:     Dyspnea:  Without evidence of hypoxia, received duoneb x2, CTA pending, CXR negative.  BNP negative, respiratory panel negative, trops negative. If CTA negative, plan home with albuterol  as needed, see PCP in follow up.   Received benefit from duonebs, suspect underlying obstructive disease with acute viral insult as cause.    Clinical Impression:  1. Dyspnea, unspecified type      Data Unavailable          Final Clinical Impression(s) / ED Diagnoses Final diagnoses:  Dyspnea, unspecified type    Rx / DC Orders ED Discharge Orders     None         Ernestina Headland, MD 05/10/23 1525    Ernestina Headland, MD 05/10/23 1525    Ernestina Headland, MD 05/10/23 1527    Roberts Ching, MD 05/21/23 [id number]

## 2023-05-10 NOTE — ED Provider Notes (Signed)
 Change of shift, care signed out from Dr. Darra, patient is 43 years old with a respiratory illness, there is wheezing, no hypoxia, CT scan negative for pulmonary embolism or infiltrate, patient stable for discharge with albuterol  inhaler and prednisone , potassium replaced   Cleotilde Rogue, MD 05/10/23 1635
